# Patient Record
Sex: Female | Born: 1990
Health system: Southern US, Community
[De-identification: ages and names within clinical notes are randomized; demographics above are authoritative.]

## PROBLEM LIST (undated history)

## (undated) DIAGNOSIS — Z789 Other specified health status: Secondary | ICD-10-CM

## (undated) HISTORY — PX: LAPAROSCOPIC GASTRIC SLEEVE RESECTION: SHX5895

## (undated) HISTORY — PX: WISDOM TOOTH EXTRACTION: SHX21

## (undated) HISTORY — PX: ADENOIDECTOMY: SUR15

---

## 2018-09-22 DIAGNOSIS — Z6841 Body Mass Index (BMI) 40.0 and over, adult: Secondary | ICD-10-CM | POA: Diagnosis not present

## 2018-09-22 DIAGNOSIS — Z7189 Other specified counseling: Secondary | ICD-10-CM | POA: Diagnosis not present

## 2019-06-02 DIAGNOSIS — Z3687 Encounter for antenatal screening for uncertain dates: Secondary | ICD-10-CM | POA: Diagnosis not present

## 2019-06-02 DIAGNOSIS — Z3A09 9 weeks gestation of pregnancy: Secondary | ICD-10-CM | POA: Diagnosis not present

## 2019-06-02 DIAGNOSIS — O99211 Obesity complicating pregnancy, first trimester: Secondary | ICD-10-CM | POA: Diagnosis not present

## 2019-06-02 DIAGNOSIS — E669 Obesity, unspecified: Secondary | ICD-10-CM | POA: Diagnosis not present

## 2019-06-02 DIAGNOSIS — Z3689 Encounter for other specified antenatal screening: Secondary | ICD-10-CM | POA: Diagnosis not present

## 2019-06-16 ENCOUNTER — Inpatient Hospital Stay (HOSPITAL_COMMUNITY): Payer: 59

## 2019-06-16 ENCOUNTER — Encounter (HOSPITAL_COMMUNITY): Payer: Self-pay | Admitting: *Deleted

## 2019-06-16 ENCOUNTER — Other Ambulatory Visit: Payer: Self-pay

## 2019-06-16 ENCOUNTER — Inpatient Hospital Stay (HOSPITAL_COMMUNITY)
Admission: AD | Admit: 2019-06-16 | Discharge: 2019-06-16 | Disposition: A | Discharge status: Home / Self Care | Payer: 59 | Attending: Obstetrics and Gynecology | Admitting: Obstetrics and Gynecology

## 2019-06-16 DIAGNOSIS — Z3A10 10 weeks gestation of pregnancy: Secondary | ICD-10-CM | POA: Insufficient documentation

## 2019-06-16 DIAGNOSIS — O209 Hemorrhage in early pregnancy, unspecified: Secondary | ICD-10-CM | POA: Insufficient documentation

## 2019-06-16 DIAGNOSIS — Z880 Allergy status to penicillin: Secondary | ICD-10-CM | POA: Diagnosis not present

## 2019-06-16 HISTORY — DX: Other specified health status: Z78.9

## 2019-06-16 LAB — CBC
HCT: 39.4 % (ref 36.0–46.0)
Hemoglobin: 13.4 g/dL (ref 12.0–15.0)
MCH: 29.1 pg (ref 26.0–34.0)
MCHC: 34 g/dL (ref 30.0–36.0)
MCV: 85.7 fL (ref 80.0–100.0)
Platelets: 220 10*3/uL (ref 150–400)
RBC: 4.6 MIL/uL (ref 3.87–5.11)
RDW: 14.4 % (ref 11.5–15.5)
WBC: 13 10*3/uL — ABNORMAL HIGH (ref 4.0–10.5)
nRBC: 0 % (ref 0.0–0.2)

## 2019-06-16 LAB — WET PREP, GENITAL
Clue Cells Wet Prep HPF POC: NONE SEEN
Sperm: NONE SEEN
Trich, Wet Prep: NONE SEEN
Yeast Wet Prep HPF POC: NONE SEEN

## 2019-06-16 LAB — POCT PREGNANCY, URINE: Preg Test, Ur: POSITIVE — AB

## 2019-06-16 LAB — HCG, QUANTITATIVE, PREGNANCY: hCG, Beta Chain, Quant, S: 77247 m[IU]/mL — ABNORMAL HIGH (ref <5)

## 2019-06-16 NOTE — MAU Note (Signed)
Worked last night, when she woke up around 12, she was experiencing some vag bleeding.  This happened 2 days ago too. Having mild cramping here and there.  Pt is about 10wks preg. Had an [place] on 6/26, everything was fine. Had US/care at [facility] in [place]. Called about the bleeding was told to go to the ER.

## 2019-06-16 NOTE — MAU Provider Note (Signed)
History     CSN: [id number]  Arrival date and time: 06/16/19 1613   First Provider Initiated Contact with Patient 06/16/19 1731      Chief Complaint  Patient presents with  . Vaginal Bleeding  . Abdominal Pain   HPI Kelly Strickland is a 28 y.o. G1P0 at [redacted]w[redacted]d who presents  OB History    Gravida  1   Para      Term      Preterm      AB      Living        SAB      TAB      Ectopic      Multiple      Live Births              Past Medical History:  Diagnosis Date  . Medical history non-contributory     Past Surgical History:  Procedure Laterality Date  . ADENOIDECTOMY    . WISDOM TOOTH EXTRACTION      History reviewed. No pertinent family history.  Social History   Tobacco Use  . Smoking status: Never Smoker  Substance Use Topics  . Alcohol use: Not Currently    Frequency: Never  . Drug use: Never    Allergies:  Allergies  Allergen Reactions  . Penicillins Diarrhea and Rash    Amoxacillin     No medications prior to admission.    Review of Systems Physical Exam   Blood pressure 127/74, pulse (!) 107, temperature 98.4 F (36.9 C), temperature source Oral, resp. rate 17, height 5\' 3"  (1.6 m), weight 108.7 kg, last menstrual period 03/28/2019, SpO2 100 %.  Physical Exam  MAU Course  Procedures Results for orders placed or performed during the hospital encounter of 06/16/19 (from the past 24 hour(s))  Pregnancy, urine POC     Status: Abnormal   Collection Time: 06/16/19  4:51 PM  Result Value Ref Range   Preg Test, Ur POSITIVE (A) NEGATIVE  Wet prep, genital     Status: Abnormal   Collection Time: 06/16/19  6:44 PM   Specimen: Vaginal  Result Value Ref Range   Yeast Wet Prep HPF POC NONE SEEN NONE SEEN   Trich, Wet Prep NONE SEEN NONE SEEN   Clue Cells Wet Prep HPF POC NONE SEEN NONE SEEN   WBC, Wet Prep HPF POC FEW (A) NONE SEEN   Sperm NONE SEEN    US Ob Comp Less 14 Wks  Result Date: 06/16/2019 CLINICAL DATA:   Bleeding EXAM: OBSTETRIC <14 WK ULTRASOUND TECHNIQUE: Transabdominal ultrasound was performed for evaluation of the gestation as well as the maternal uterus and adnexal regions. COMPARISON:  None. FINDINGS: Intrauterine gestational sac: Single Yolk sac:  Not Visualized. Embryo:  Visualized. Cardiac Activity: Visualized. Heart Rate: 159 bpm CRL:  37.6 mm 10 w 4 d                  [place] EDC: 01/08/2020 Subchorionic hemorrhage:  None visualized. Maternal uterus/adnexae: The left ovary is unremarkable. There is a probable 3.1 x 2.5 x 2.9 cm corpus luteal cyst involving the right ovary. A trace amount of free fluid is noted. IMPRESSION: Single live IUP at 10 weeks and 4 days Electronically Signed   By: Constance Holster M.D.   On: 06/16/2019 19:25   MDM UA, UPT CBC, HCG ABO/Rh- A Pos per patient Wet prep and gc/chlamydia US OB Comp Less 14 weeks with Transvaginal  Blood samples lost by lab.  Viable IUP noted on ultrasound and patient confident of A Pos blood type. Declines redraw of blood work  Assessment and Plan   1. Vaginal bleeding in pregnancy, first trimester   2. Vaginal bleeding affecting early pregnancy   3. [redacted] weeks gestation of pregnancy    -Discharge home in stable condition -Vaginal bleeding precautions discussed -Patient advised to follow-up with OB of choice to start prenatal care, list given -Patient may return to MAU as needed or if her condition were to change or worsen   Rolm BookbinderCaroline M  CNM 06/16/2019, 5:31 PM

## 2019-06-16 NOTE — Discharge Instructions (Signed)
[facility]    [facility]       Phone: [phone]  [facility]   Phone: [phone]  [facility] [place]  Phone: [phone]  [facility]  Phone: [phone]  [facility] [place]  Phone: [phone]  [facility] at [facility]   Phone: [phone]  [facility]       Phone: [phone]  [facility]    Phone: [phone]   [facility]    Phone: [phone]  [facility]    Phone: [phone]  [facility]    Phone: [phone]  [facility]       Phone: [phone]   [facility]  Phone: [phone]  [facility]    Phone: [phone]  [facility]   Phone: [phone]  [facility]      Phone: [phone]  [facility]    Phone: [phone]  Center for [facility] Providers          [facility] for [facility]   Phone: [phone]  Center for [facility]   Phone: [phone]  [facility]       Phone: [phone]            [facility] [place]     Phone: [phone]          [facility] for [facility]   Phone: [phone]  Center for [facility]  Phone: [phone]     Center for [facility] [place]  Phone: [phone] Safe Medications in Pregnancy   Acne: Benzoyl Peroxide Salicylic Acid  Backache/Headache: Tylenol: 2 regular strength every 4 hours OR              2 Extra strength every 6 hours  Colds/Coughs/Allergies: Benadryl (alcohol free) 25 mg every 6 hours  as needed Breath right strips Claritin Cepacol throat lozenges Chloraseptic throat spray Cold-Eeze- up to three times per day Cough drops, alcohol free Flonase (by prescription only) Guaifenesin Mucinex Robitussin DM (plain only, alcohol free) Saline nasal spray/drops Sudafed (pseudoephedrine) & Actifed ** use only after [redacted] weeks gestation and if you do not have high blood pressure Tylenol Vicks Vaporub Zinc lozenges Zyrtec   Constipation: Colace Ducolax suppositories Fleet enema Glycerin suppositories Metamucil Milk of magnesia Miralax Senokot Smooth move tea  Diarrhea: Kaopectate Imodium A-D  *NO pepto Bismol  Hemorrhoids: Anusol Anusol HC Preparation H Tucks  Indigestion: Tums Maalox Mylanta Zantac  Pepcid  Insomnia: Benadryl (alcohol free) 25mg  every 6 hours as needed Tylenol PM Unisom, no Gelcaps  Leg Cramps: Tums MagGel  Nausea/Vomiting:  Bonine Dramamine Emetrol Ginger extract Sea bands Meclizine  Nausea medication to take during pregnancy:  Unisom (doxylamine succinate 25 mg tablets) Take one tablet daily at bedtime. If symptoms are not adequately controlled, the dose can be increased to a maximum recommended dose of two tablets daily (1/2 tablet in the morning, 1/2 tablet mid-afternoon and one at bedtime). Vitamin B6 100mg  tablets. Take one tablet twice a day (up to 200 mg per day).  Skin Rashes: Aveeno products Benadryl cream or 25mg  every 6 hours as needed Calamine Lotion 1% cortisone cream  Yeast infection: Gyne-lotrimin 7 Monistat  7   **If taking multiple medications, please check labels to avoid duplicating the same active ingredients **take medication as directed on the label ** Do not exceed 4000 mg of tylenol in 24 hours **Do not take medications that contain aspirin or ibuprofen

## 2019-06-17 LAB — ABO/RH: ABO/RH(D): A POS

## 2019-06-20 LAB — GC/CHLAMYDIA PROBE AMP (~~LOC~~) NOT AT ARMC
Chlamydia: NEGATIVE
Neisseria Gonorrhea: NEGATIVE

## 2019-07-07 DIAGNOSIS — Z3402 Encounter for supervision of normal first pregnancy, second trimester: Secondary | ICD-10-CM | POA: Diagnosis not present

## 2019-07-07 DIAGNOSIS — Z3A14 14 weeks gestation of pregnancy: Secondary | ICD-10-CM | POA: Diagnosis not present

## 2019-07-18 DIAGNOSIS — O039 Complete or unspecified spontaneous abortion without complication: Secondary | ICD-10-CM | POA: Diagnosis not present

## 2019-07-18 DIAGNOSIS — Z3A16 16 weeks gestation of pregnancy: Secondary | ICD-10-CM | POA: Diagnosis not present

## 2019-07-26 DIAGNOSIS — O039 Complete or unspecified spontaneous abortion without complication: Secondary | ICD-10-CM | POA: Diagnosis not present

## 2019-11-02 DIAGNOSIS — Z20828 Contact with and (suspected) exposure to other viral communicable diseases: Secondary | ICD-10-CM | POA: Diagnosis not present

## 2019-11-07 ENCOUNTER — Other Ambulatory Visit: Payer: Self-pay

## 2019-11-07 ENCOUNTER — Emergency Department (INDEPENDENT_AMBULATORY_CARE_PROVIDER_SITE_OTHER)
Admission: EM | Admit: 2019-11-07 | Discharge: 2019-11-07 | Disposition: A | Discharge status: Home / Self Care | Payer: 59 | Source: Home / Self Care | Attending: Family Medicine | Admitting: Family Medicine

## 2019-11-07 DIAGNOSIS — U071 COVID-19: Secondary | ICD-10-CM

## 2019-11-07 NOTE — Discharge Instructions (Addendum)
If COVID-19 test is positive, isolate yourself until the below conditions are met: °1)  At least 7 days since symptoms onset. °AND °2)  > 72 hours after symptom resolution (absence of fever without the use of fever-reducing medicine, and improvement in respiratory symptoms. ° °   °

## 2019-11-07 NOTE — ED Triage Notes (Signed)
Loss of smell on Tuesday.  Exposure to COVID

## 2019-11-10 NOTE — ED Provider Notes (Signed)
[name]    CSN: [id number] Arrival date & time: 11/07/19  1122      History   Chief Complaint Chief Complaint  Patient presents with  . exposure to COVID  . loss of smell    HPI Kelly Strickland is a 28 y.o. female.   Patient reports that she lost her sense of taste one week ago, and also decreased sense of smell.  She feels well otherwise.   She denies chest tightness, shortness of breath, and fever.    The history is provided by the patient.    Past Medical History:  Diagnosis Date  . Medical history non-contributory     There are no active problems to display for this patient.   Past Surgical History:  Procedure Laterality Date  . ADENOIDECTOMY    . WISDOM TOOTH EXTRACTION      OB History    Gravida  1   Para      Term      Preterm      AB      Living        SAB      TAB      Ectopic      Multiple      Live Births               Home Medications    Prior to Admission medications   Not on File    Family History History reviewed. No pertinent family history.  Social History Social History   Tobacco Use  . Smoking status: Never Smoker  Substance Use Topics  . Alcohol use: Not Currently    Frequency: Never  . Drug use: Never     Allergies   Penicillins   Review of Systems Review of Systems No sore throat No cough No pleuritic pain No wheezing No nasal congestion No post-nasal drainage No sinus pain/pressure No itchy/red eyes No earache No hemoptysis No SOB No fever/chills No nausea No vomiting No abdominal pain No diarrhea No urinary symptoms No skin rash No fatigue No myalgias No headache   Physical Exam Triage Vital Signs ED Triage Vitals  Enc Vitals Group     BP 11/07/19 1231 130/84     Pulse Rate 11/07/19 1231 93     Resp 11/07/19 1231 20     Temp 11/07/19 1231 98.6 F (37 C)     Temp Source 11/07/19 1231 Oral     SpO2 11/07/19 1231 99 %     Weight 11/07/19 1232 243 lb  (110.2 kg)     Height 11/07/19 1232 _0  (1.6 m)     Head Circumference --      Peak Flow --      Pain Score 11/07/19 1233 0     Pain Loc --      Pain Edu? --      Excl. in [place]? --    No data found.  Updated Vital Signs BP 130/84 (BP Location: Right Arm)   Pulse 93   Temp 98.6 F (37 C) (Oral)   Resp 20   Ht _1  (1.6 m)   Wt 110.2 kg   LMP 10/22/2019   SpO2 99%   Breastfeeding Unknown   BMI 43.05 kg/m   Visual Acuity Right Eye Distance:   Left Eye Distance:   Bilateral Distance:    Right Eye Near:   Left Eye Near:    Bilateral Near:     Physical Exam Nursing notes  and Vital Signs reviewed. Appearance:  Patient appears stated age, and in no acute distress Eyes:  Pupils are equal, round, and reactive to light and accomodation.  Extraocular movement is intact.  Conjunctivae are not inflamed  Ears:  Canals normal.  Tympanic membranes normal.  Nose:  Mildly congested turbinates.  No sinus tenderness.    Pharynx:  Normal Neck:  Supple. No adenopathy    Lungs:  Clear to auscultation.  Breath sounds are equal.  Moving air well. Heart:  Regular rate and rhythm without murmurs, rubs, or gallops.  Abdomen:  Nontender without masses or hepatosplenomegaly.  Bowel sounds are present.  No CVA or flank tenderness.  Extremities:  No edema.  Skin:  No rash present.   UC Treatments / Results  Labs (all labs ordered are listed, but only abnormal results are displayed) Labs Reviewed - No data to display POC SARS COVID19 positive EKG   Radiology No results found.  Procedures Procedures (including critical care time)  Medications Ordered in UC Medications - No data to display  Initial Impression / Assessment and Plan / UC Course  I have reviewed the triage vital signs and the nursing notes.  Pertinent labs & imaging results that were available during my care of the patient were reviewed by me and considered in my medical decision making (see chart for details).     Benign exam.  There is no evidence of bacterial infection today.  Treat symptomatically for now. If symptoms become significantly worse during the night or over the weekend, proceed to the local emergency room.    Final Clinical Impressions(s) / UC Diagnoses   Final diagnoses:  [id number] virus infection     Discharge Instructions     If COVID-19 test is positive, isolate yourself until the below conditions are met: 1)  At least 7 days since symptoms onset. AND 2)  > 72 hours after symptom resolution (absence of fever without the use of fever-reducing medicine, and improvement in respiratory symptoms.       ED Prescriptions    None        Kandra Nicolas, MD 11/10/19 [phone]

## 2020-05-08 DIAGNOSIS — R7303 Prediabetes: Secondary | ICD-10-CM | POA: Diagnosis not present

## 2020-05-08 DIAGNOSIS — R790 Abnormal level of blood mineral: Secondary | ICD-10-CM | POA: Diagnosis not present

## 2020-05-08 DIAGNOSIS — R635 Abnormal weight gain: Secondary | ICD-10-CM | POA: Diagnosis not present

## 2020-05-08 DIAGNOSIS — E559 Vitamin D deficiency, unspecified: Secondary | ICD-10-CM | POA: Diagnosis not present

## 2020-05-10 DIAGNOSIS — Z1331 Encounter for screening for depression: Secondary | ICD-10-CM | POA: Diagnosis not present

## 2020-05-10 DIAGNOSIS — E559 Vitamin D deficiency, unspecified: Secondary | ICD-10-CM | POA: Diagnosis not present

## 2020-05-10 DIAGNOSIS — Z6841 Body Mass Index (BMI) 40.0 and over, adult: Secondary | ICD-10-CM | POA: Diagnosis not present

## 2020-05-10 DIAGNOSIS — R7303 Prediabetes: Secondary | ICD-10-CM | POA: Diagnosis not present

## 2020-05-10 DIAGNOSIS — Z1339 Encounter for screening examination for other mental health and behavioral disorders: Secondary | ICD-10-CM | POA: Diagnosis not present

## 2020-05-10 DIAGNOSIS — R790 Abnormal level of blood mineral: Secondary | ICD-10-CM | POA: Diagnosis not present

## 2020-05-27 DIAGNOSIS — R7303 Prediabetes: Secondary | ICD-10-CM | POA: Diagnosis not present

## 2020-05-27 DIAGNOSIS — Z6841 Body Mass Index (BMI) 40.0 and over, adult: Secondary | ICD-10-CM | POA: Diagnosis not present

## 2020-06-06 DIAGNOSIS — Z6841 Body Mass Index (BMI) 40.0 and over, adult: Secondary | ICD-10-CM | POA: Diagnosis not present

## 2020-06-06 DIAGNOSIS — R7303 Prediabetes: Secondary | ICD-10-CM | POA: Diagnosis not present

## 2020-07-10 IMAGING — US OBSTETRIC <14 WK ULTRASOUND
1 series · 15 of 28 positions shown · non-contrast
Comparison: None.

CLINICAL DATA: Bleeding

EXAM:
OBSTETRIC <14 WK ULTRASOUND
TECHNIQUE: Transabdominal ultrasound was performed for evaluation of the
gestation as well as the maternal uterus and adnexal regions.

[Series 1: obstetric <14 wk ultrasound · 15 of 35 slices shown]
[im 1/35]
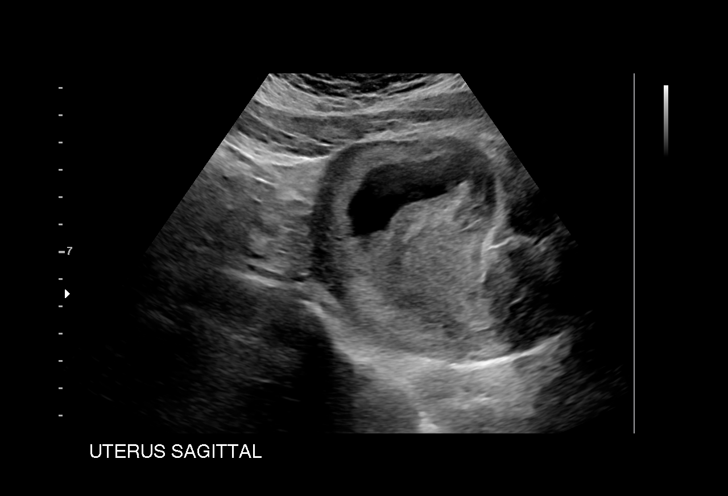
[im 3/35]
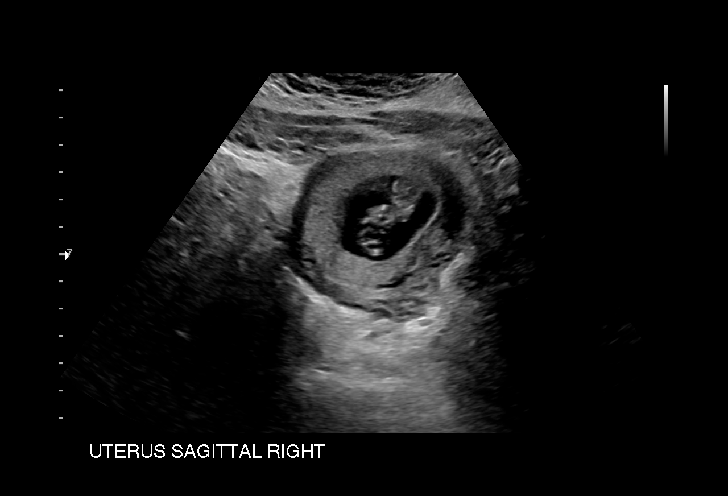
[im 6/35]
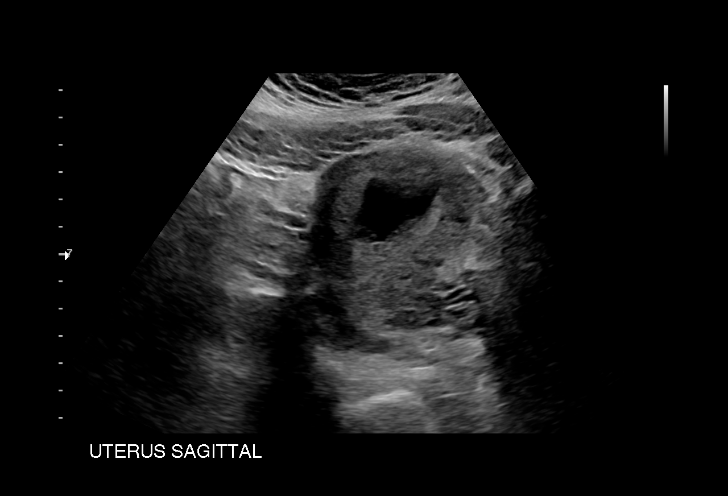
[im 8/35]
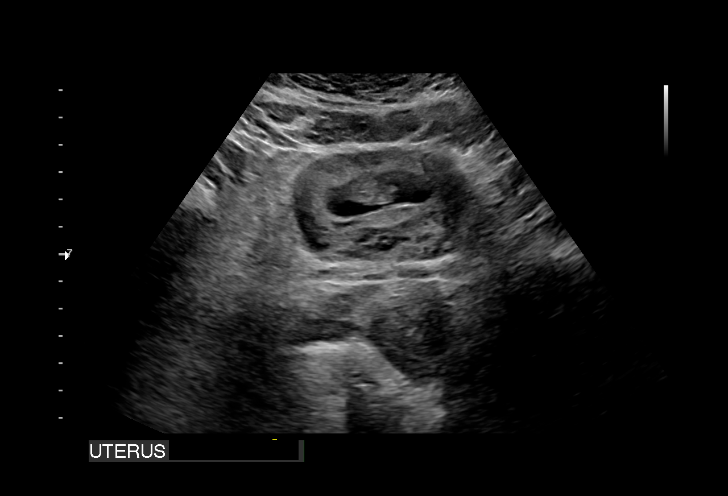
[im 11/35]
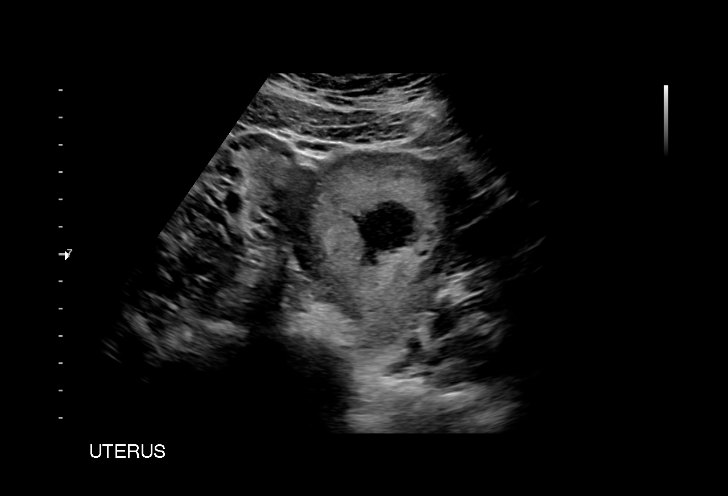
[im 13/35]
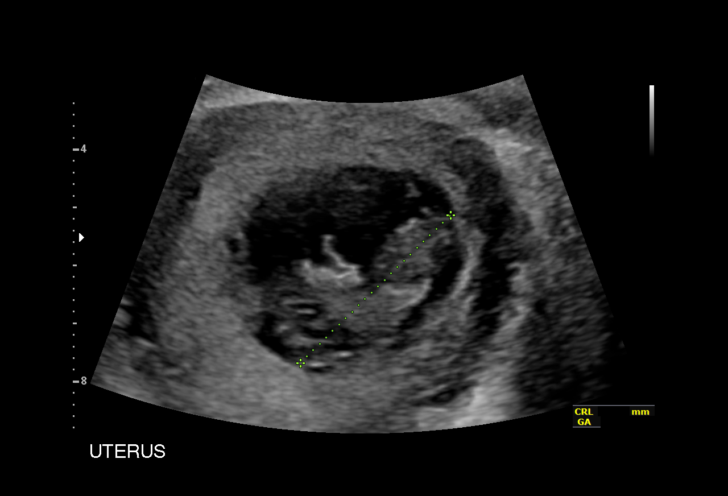
[im 16/35]
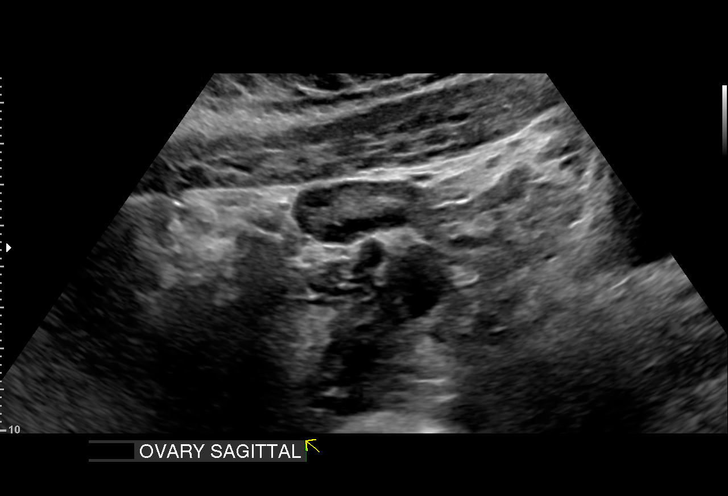
[im 18/35]
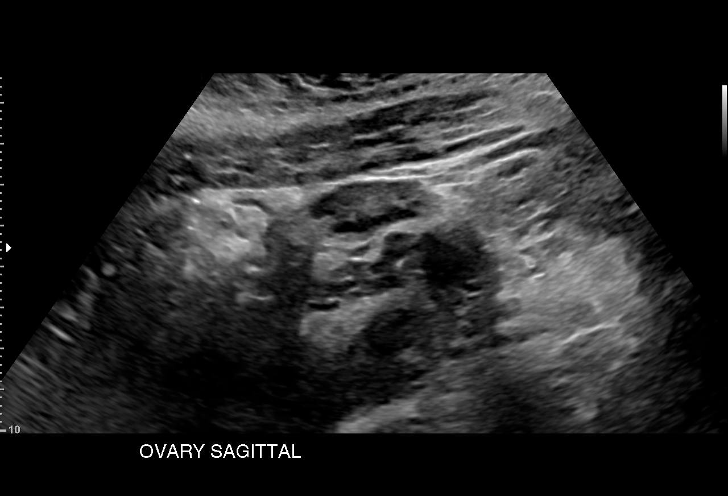
[im 19/35]
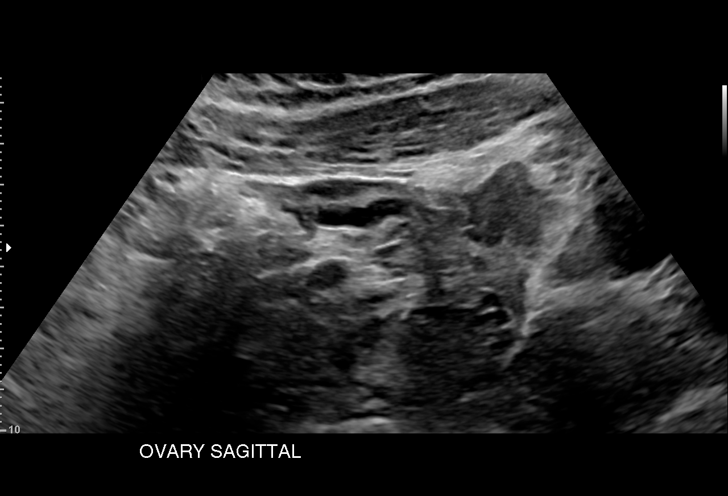
[im 22/35]
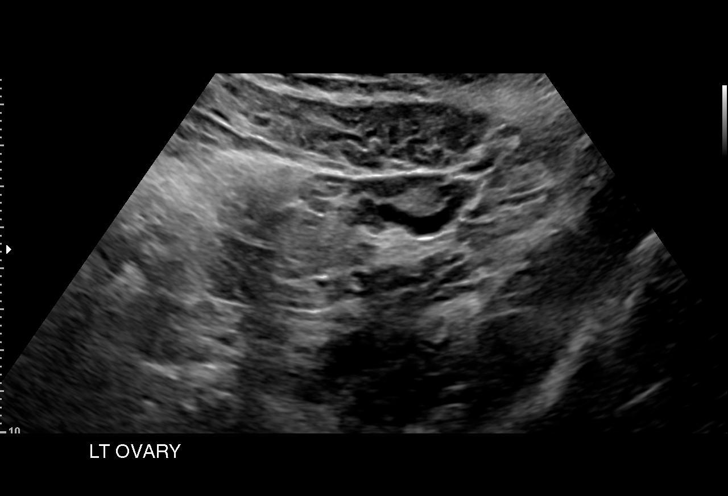
[im 24/35]
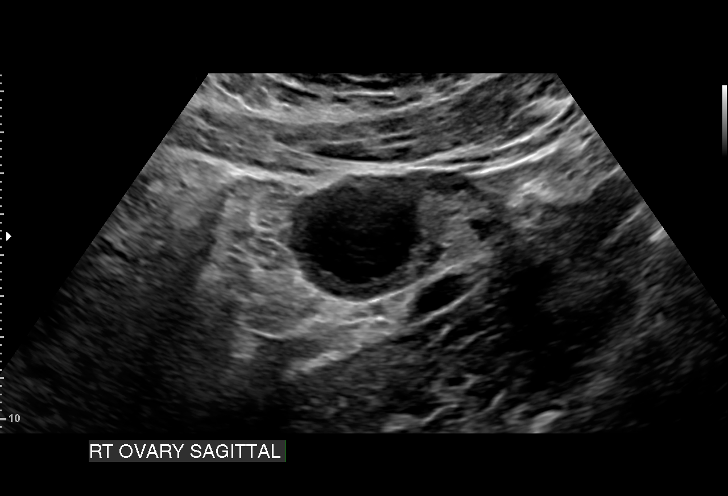
[im 27/35]
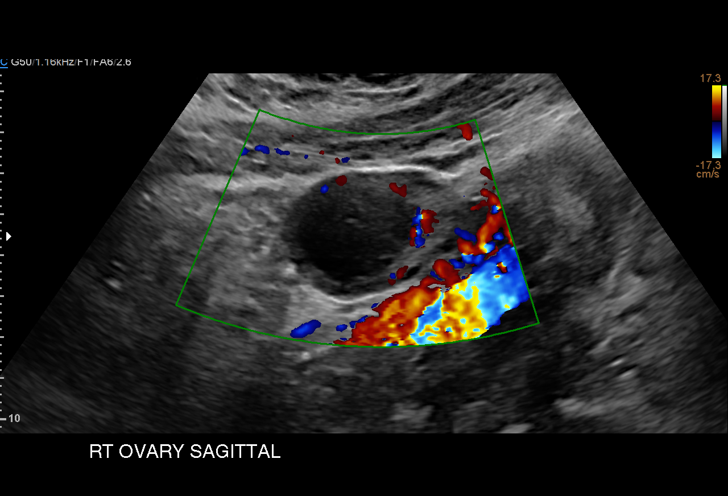
[im 29/35]
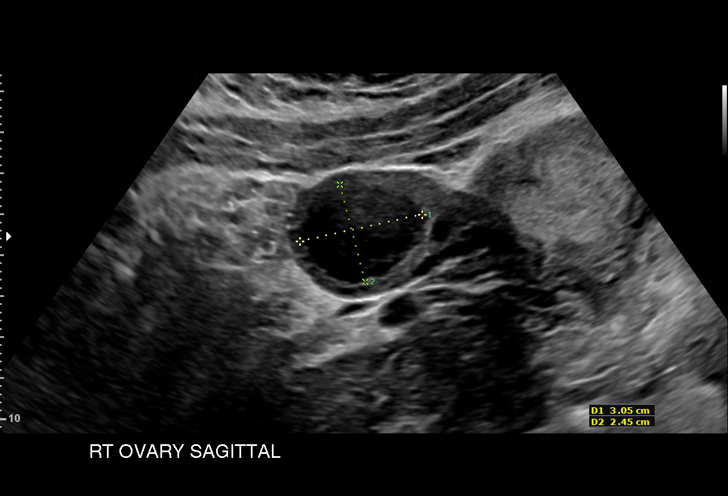
[im 32/35]
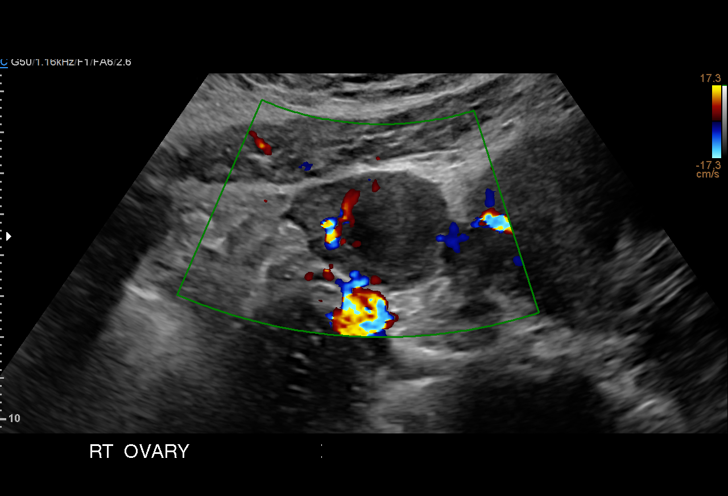
[im 35/35]
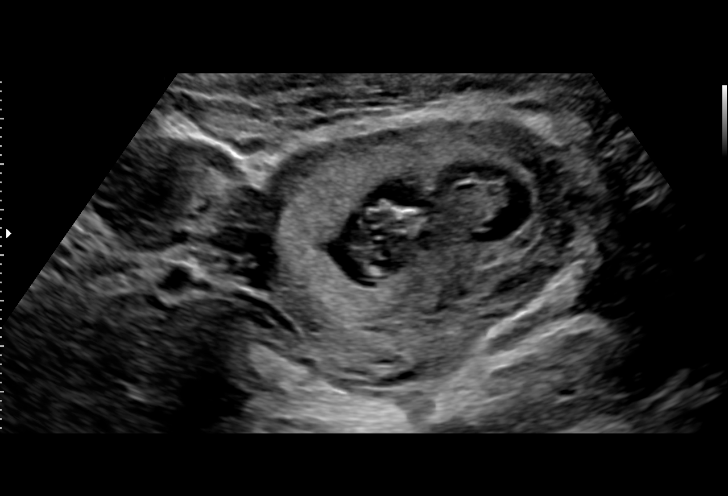

[15 of 28 positions shown; findings below may reference images not displayed]

FINDINGS: Intrauterine gestational sac: Single

Yolk sac:  Not Visualized.

Embryo:  Visualized.

Cardiac Activity: Visualized.

Heart Rate: 159 bpm

CRL:  37.6 mm 10 w 4 d                  US EDC: 01/08/2020

Subchorionic hemorrhage:  None visualized.

Maternal uterus/adnexae: The left ovary is unremarkable. There is a
probable 3.1 x 2.5 x 2.9 cm corpus luteal cyst involving the right
ovary. A trace amount of free fluid is noted.
IMPRESSION: Single live IUP at 10 weeks and 4 days

## 2020-07-11 DIAGNOSIS — H6123 Impacted cerumen, bilateral: Secondary | ICD-10-CM | POA: Diagnosis not present

## 2020-07-11 DIAGNOSIS — Z6841 Body Mass Index (BMI) 40.0 and over, adult: Secondary | ICD-10-CM | POA: Diagnosis not present

## 2020-07-11 DIAGNOSIS — J302 Other seasonal allergic rhinitis: Secondary | ICD-10-CM | POA: Diagnosis not present

## 2020-07-19 DIAGNOSIS — Z23 Encounter for immunization: Secondary | ICD-10-CM | POA: Diagnosis not present

## 2022-03-25 ENCOUNTER — Other Ambulatory Visit: Payer: Self-pay

## 2022-03-25 ENCOUNTER — Encounter (HOSPITAL_COMMUNITY): Payer: Self-pay | Admitting: Emergency Medicine

## 2022-03-25 ENCOUNTER — Emergency Department (HOSPITAL_COMMUNITY)
Admission: EM | Admit: 2022-03-25 | Discharge: 2022-03-26 | Discharge status: Against Medical Advice | Payer: Managed Care, Other (non HMO) | Attending: Emergency Medicine | Admitting: Emergency Medicine

## 2022-03-25 DIAGNOSIS — F419 Anxiety disorder, unspecified: Secondary | ICD-10-CM | POA: Insufficient documentation

## 2022-03-25 DIAGNOSIS — Z5321 Procedure and treatment not carried out due to patient leaving prior to being seen by health care provider: Secondary | ICD-10-CM | POA: Diagnosis not present

## 2022-03-25 NOTE — ED Triage Notes (Signed)
Pt reports feeling anxious, no specific trigger. Denies pain/shortness of breath. ?

## 2022-03-26 NOTE — ED Notes (Signed)
Patient left.
